# Patient Record
Sex: Male | Born: 2000 | Race: White | Hispanic: No | Marital: Single | Smoking: Former smoker
Health system: Southern US, Community
[De-identification: ages and names within clinical notes are randomized; demographics above are authoritative.]

## PROBLEM LIST (undated history)

## (undated) DIAGNOSIS — J45909 Unspecified asthma, uncomplicated: Secondary | ICD-10-CM

---

## 2018-06-17 ENCOUNTER — Emergency Department (INDEPENDENT_AMBULATORY_CARE_PROVIDER_SITE_OTHER): Payer: Managed Care, Other (non HMO)

## 2018-06-17 ENCOUNTER — Encounter: Payer: Self-pay | Admitting: Family Medicine

## 2018-06-17 ENCOUNTER — Other Ambulatory Visit: Payer: Self-pay

## 2018-06-17 ENCOUNTER — Emergency Department (INDEPENDENT_AMBULATORY_CARE_PROVIDER_SITE_OTHER)
Admission: EM | Admit: 2018-06-17 | Discharge: 2018-06-17 | Disposition: A | Discharge status: Home / Self Care | Payer: Managed Care, Other (non HMO) | Source: Home / Self Care

## 2018-06-17 DIAGNOSIS — X58XXXA Exposure to other specified factors, initial encounter: Secondary | ICD-10-CM

## 2018-06-17 DIAGNOSIS — S62339A Displaced fracture of neck of unspecified metacarpal bone, initial encounter for closed fracture: Secondary | ICD-10-CM

## 2018-06-17 DIAGNOSIS — S62306A Unspecified fracture of fifth metacarpal bone, right hand, initial encounter for closed fracture: Secondary | ICD-10-CM

## 2018-06-17 NOTE — ED Provider Notes (Addendum)
[facility]    CSN: [id number] Arrival date & time: 06/17/18  1752     History   Chief Complaint Chief Complaint  Patient presents with  . Hand Injury    HPI Daniel Lowe is a 17 y.o. male.   This is a 17 year old man who presents with right hand pain.  2 days ago he struck the outside of his right hand on the back of a chair with resultant pain and swelling.  This pain is persisted along with the swelling.     History reviewed. No pertinent past medical history.  There are no active problems to display for this patient.   History reviewed. No pertinent surgical history.     Home Medications    Prior to Admission medications   Medication Sig Start Date End Date Taking? Authorizing Provider  griseofulvin (GRIFULVIN V) 500 MG tablet Take by mouth daily.   Yes [provider]    Family History No family history on file.  Social History Social History   Tobacco Use  . Smoking status: Not on file  Substance Use Topics  . Alcohol use: Not on file  . Drug use: Not on file     Allergies   Patient has no known allergies.   Review of Systems Review of Systems   Physical Exam Triage Vital Signs ED Triage Vitals  Enc Vitals Group     BP      Pulse      Resp      Temp      Temp src      SpO2      Weight      Height      Head Circumference      Peak Flow      Pain Score      Pain Loc      Pain Edu?      Excl. in GC?    No data found.  Updated Vital Signs BP 99/69 (BP Location: Left Arm)   Pulse 96   Temp 98.6 F (37 C) (Oral)   Resp 16   Ht 6' (1.829 m)   Wt 68 kg   SpO2 98%   BMI 20.34 kg/m    Physical Exam  Constitutional: He is oriented to person, place, and time. He appears well-developed and well-nourished. No distress.  HENT:  Head: Normocephalic and atraumatic.  Right Ear: External ear normal.  Left Ear: External ear normal.  Mouth/Throat: Oropharynx is clear and moist.  Eyes: Pupils are equal,  round, and reactive to light. Conjunctivae are normal. No scleral icterus.  Neck: Normal range of motion. Neck supple.  Cardiovascular: Normal rate.  Pulmonary/Chest: Effort normal.  Musculoskeletal: Normal range of motion. He exhibits tenderness. He exhibits no deformity.  Mild swelling over the fifth meta carpal bone of the right hand  Neurological: He is alert and oriented to person, place, and time.  Skin: Skin is warm and dry. Rash noted. He is not diaphoretic.  2 cm area of dry skin on the upper triceps region of his left arm.  Psychiatric: He has a normal mood and affect. His behavior is normal.  Nursing note and vitals reviewed.    UC Treatments / Results  Labs (all labs ordered are listed, but only abnormal results are displayed) Labs Reviewed - No data to display  EKG None  Radiology Dg Hand Complete Right  Result Date: 06/17/2018 CLINICAL DATA:  Right hand trauma 2 days ago. Fifth metacarpal  pain. EXAM: RIGHT HAND - COMPLETE 3+ VIEW COMPARISON:  None. FINDINGS: Acute, closed, slightly comminuted nondisplaced fracture of the right fifth metacarpal shaft. No joint dislocation or intra-articular extension. Associated mild soft tissue swelling along the ulnar aspect of the hand. IMPRESSION: Acute, closed, nondisplaced and slightly comminuted right fifth metacarpal diaphyseal fracture without intra-articular extension. Ulnar-sided soft tissue swelling adjacent fracture. Electronically Signed   By: Tollie Eth M.D.   On: 06/17/2018 19:06    Procedures Splint Application Date/Time: 06/17/2018 6:53 PM Performed by: Elvina Sidle, MD Authorized by: Elvina Sidle, MD   Consent:    Consent obtained:  Verbal   Consent given by:  Patient and parent   Risks discussed:  Pain and swelling   Alternatives discussed:  No treatment Pre-procedure details:    Sensation:  Normal Procedure details:    Laterality:  Right   Location:  Arm   Arm:  R lower arm   Strapping: no      Splint type:  Ulnar gutter   Supplies:  Or[facility] Post-procedure details:    Pain:  Unchanged   Sensation:  Normal   Patient tolerance of procedure:  Tolerated well, no immediate complications   (including critical care time)  Medications Ordered in UC Medications - No data to display  Initial Impression / Assessment and Plan / UC Course  I have reviewed the triage vital signs and the nursing notes.  Pertinent labs & imaging results that were available during my care of the patient were reviewed by me and considered in my medical decision making (see chart for details).    Final Clinical Impressions(s) / UC Diagnoses   Final diagnoses:  Closed boxer's fracture, initial encounter     Discharge Instructions     You have Fifth metacarpal fracture on the right hand which is nondisplaced and without angulation.  This is often called a boxer's fracture.  You are going to need to keep the hand immobilized in a splint for at least 3 weeks and possibly 5 weeks.  The length of time depends on how well you are able to keep the hand immobilized and your natural healing tendency.  You will need to see your orthopedic in a week.  Please call them to make an appointment.    ED Prescriptions    None     Controlled Substance Prescriptions [place] Controlled Substance Registry consulted? Not Applicable   Elvina Sidle, MD 06/17/18 Hulen Luster, MD 06/17/18 Lynelle Smoke    Elvina Sidle, MD 06/17/18 [phone]

## 2018-06-17 NOTE — ED Triage Notes (Signed)
Patient hit hand on chair 2-3 days ago; he has used ice and taken OTCs without relief of pain.

## 2018-06-17 NOTE — Discharge Instructions (Addendum)
You have Fifth metacarpal fracture on the right hand which is nondisplaced and without angulation.  This is often called a boxer's fracture.  You are going to need to keep the hand immobilized in a splint for at least 3 weeks and possibly 5 weeks.  The length of time depends on how well you are able to keep the hand immobilized and your natural healing tendency.  You will need to see your orthopedic in a week.  Please call them to make an appointment.

## 2018-06-19 ENCOUNTER — Telehealth: Payer: Self-pay

## 2018-06-19 NOTE — Telephone Encounter (Signed)
Pt is removing splint to ice twice a day. Advil prn. Advised to call with any questions.

## 2019-04-24 ENCOUNTER — Encounter: Payer: Self-pay | Admitting: Emergency Medicine

## 2019-04-24 ENCOUNTER — Emergency Department (INDEPENDENT_AMBULATORY_CARE_PROVIDER_SITE_OTHER)
Admission: EM | Admit: 2019-04-24 | Discharge: 2019-04-24 | Disposition: A | Discharge status: Home / Self Care | Payer: Managed Care, Other (non HMO) | Source: Home / Self Care

## 2019-04-24 ENCOUNTER — Other Ambulatory Visit: Payer: Self-pay

## 2019-04-24 DIAGNOSIS — B9789 Other viral agents as the cause of diseases classified elsewhere: Secondary | ICD-10-CM | POA: Diagnosis not present

## 2019-04-24 DIAGNOSIS — J069 Acute upper respiratory infection, unspecified: Secondary | ICD-10-CM

## 2019-04-24 MED ORDER — BENZONATATE 100 MG PO CAPS: 100.0000 mg | ORAL_CAPSULE | Freq: Three times a day (TID) | ORAL | 0 refills | Status: DC

## 2019-04-24 NOTE — ED Provider Notes (Signed)
[facility]    CSN: [id number] Arrival date & time: 04/24/19  1409      History   Chief Complaint Chief Complaint  Patient presents with  . Cough    HPI Daniel Lowe is a 18 y.Lowe. male.   HPI  Daniel Lowe is a 18 y.Lowe. male presenting to UC with c/Lowe mildly productive cough with rhinorrhea the last 2 weeks. He did test positive for Covid-19 in June but had no symptoms last month.  Pt states congestion has gradually improved and cough is gradually improving as well but worse at night.  He is able to sleep through the night. Denies chest pain or SOB. No fever, chills, n/v/d. No HA or sore throat. No known sick contacts.    History reviewed. No pertinent past medical history.  There are no active problems to display for this patient.   History reviewed. No pertinent surgical history.     Home Medications    Prior to Admission medications   Medication Sig Start Date End Date Taking? Authorizing Provider  benzonatate (TESSALON) 100 MG capsule Take 1-2 capsules (100-200 mg total) by mouth every 8 (eight) hours. 04/24/19   Daniel Lowe, Daniel Canter O, PA-C  griseofulvin (GRIFULVIN V) 500 MG tablet Take by mouth daily.    [provider]    Family History No family history on file.  Social History Social History   Tobacco Use  . Smoking status: Former Games developermoker  . Smokeless tobacco: Never Used  Substance Use Topics  . Alcohol use: Not on file  . Drug use: Not on file     Allergies   Patient has no known allergies.   Review of Systems Review of Systems  Constitutional: Negative for chills and fever.  HENT: Positive for congestion and rhinorrhea. Negative for ear pain, postnasal drip, sinus pressure, sinus pain, sore throat, trouble swallowing and voice change.   Respiratory: Positive for cough. Negative for shortness of breath.   Cardiovascular: Negative for chest pain and palpitations.  Gastrointestinal: Negative for abdominal pain, diarrhea, nausea and  vomiting.  Musculoskeletal: Negative for arthralgias, back pain and myalgias.  Skin: Negative for rash.  Neurological: Negative for dizziness, light-headedness and headaches.     Physical Exam Triage Vital Signs ED Triage Vitals [04/24/19 1434]  Enc Vitals Group     BP 121/76     Pulse Rate 68     Resp      Temp 98.1 F (36.7 C)     Temp Source Oral     SpO2 99 %     Weight 145 lb (65.8 kg)     Height 6' (1.829 m)     Head Circumference      Peak Flow      Pain Score 0     Pain Loc      Pain Edu?      Excl. in GC?    No data found.  Updated Vital Signs BP 121/76 (BP Location: Right Arm)   Pulse 68   Temp 98.1 F (36.7 C) (Oral)   Ht 6' (1.829 m)   Wt 145 lb (65.8 kg)   SpO2 99%   BMI 19.67 kg/m   Visual Acuity Right Eye Distance:   Left Eye Distance:   Bilateral Distance:    Right Eye Near:   Left Eye Near:    Bilateral Near:     Physical Exam Vitals signs and nursing note reviewed.  Constitutional:      Appearance: Normal appearance.  He is well-developed.  HENT:     Head: Normocephalic and atraumatic.     Right Ear: Tympanic membrane normal.     Left Ear: Tympanic membrane normal.     Nose: Nose normal.     Right Sinus: No maxillary sinus tenderness or frontal sinus tenderness.     Left Sinus: No maxillary sinus tenderness or frontal sinus tenderness.     Mouth/Throat:     Lips: Pink.     Mouth: Mucous membranes are moist.     Pharynx: Oropharynx is clear. Uvula midline.  Neck:     Musculoskeletal: Normal range of motion.  Cardiovascular:     Rate and Rhythm: Normal rate and regular rhythm.  Pulmonary:     Effort: Pulmonary effort is normal. No respiratory distress.     Breath sounds: Normal breath sounds. No stridor. No wheezing, rhonchi or rales.  Musculoskeletal: Normal range of motion.  Skin:    General: Skin is warm and dry.  Neurological:     Mental Status: He is alert and oriented to person, place, and time.  Psychiatric:         Behavior: Behavior normal.      UC Treatments / Results  Labs (all labs ordered are listed, but only abnormal results are displayed) Labs Reviewed - No data to display  EKG   Radiology No results found.  Procedures Procedures (including critical care time)  Medications Ordered in UC Medications - No data to display  Initial Impression / Assessment and Plan / UC Course  I have reviewed the triage vital signs and the nursing notes.  Pertinent labs & imaging results that were available during my care of the patient were reviewed by me and considered in my medical decision making (see chart for details).     Lungs: CTAB Vitals: WNL No evidence of underlying bacterial infection at this time. Encouraged symptomatic tx F/u with PCP as needed.  Final Clinical Impressions(s) / UC Diagnoses   Final diagnoses:  Viral URI with cough     Discharge Instructions      You may take 500mg  acetaminophen every 4-6 hours or in combination with ibuprofen 400-600mg  every 6-8 hours as needed for pain, inflammation, and fever.  Be sure to well hydrated with clear liquids and get at least 8 hours of sleep at night, preferably more while sick.   Please follow up with family medicine in 1 week if needed.     ED Prescriptions    Medication Sig Dispense Auth. Provider   benzonatate (TESSALON) 100 MG capsule Take 1-2 capsules (100-200 mg total) by mouth every 8 (eight) hours. 21 capsule Daniel Gens, PA-C     Controlled Substance Prescriptions [place] Controlled Substance Registry consulted? Not Applicable   Daniel Lowe 04/24/19 1525

## 2019-04-24 NOTE — Discharge Instructions (Signed)
  You may take 500mg acetaminophen every 4-6 hours or in combination with ibuprofen 400-600mg every 6-8 hours as needed for pain, inflammation, and fever.  Be sure to well hydrated with clear liquids and get at least 8 hours of sleep at night, preferably more while sick.   Please follow up with family medicine in 1 week if needed.   

## 2019-04-24 NOTE — ED Triage Notes (Signed)
Patient c/o productive cough x 2 weeks, runny nose, tested positive for COVID in June.

## 2019-10-15 IMAGING — DX DG HAND COMPLETE 3+V*R*
3 series · 3 of 3 positions shown · non-contrast
Comparison: None.

CLINICAL DATA: Right hand trauma 2 days ago. Fifth metacarpal pain.

EXAM:
RIGHT HAND - COMPLETE 3+ VIEW

[hand pa]
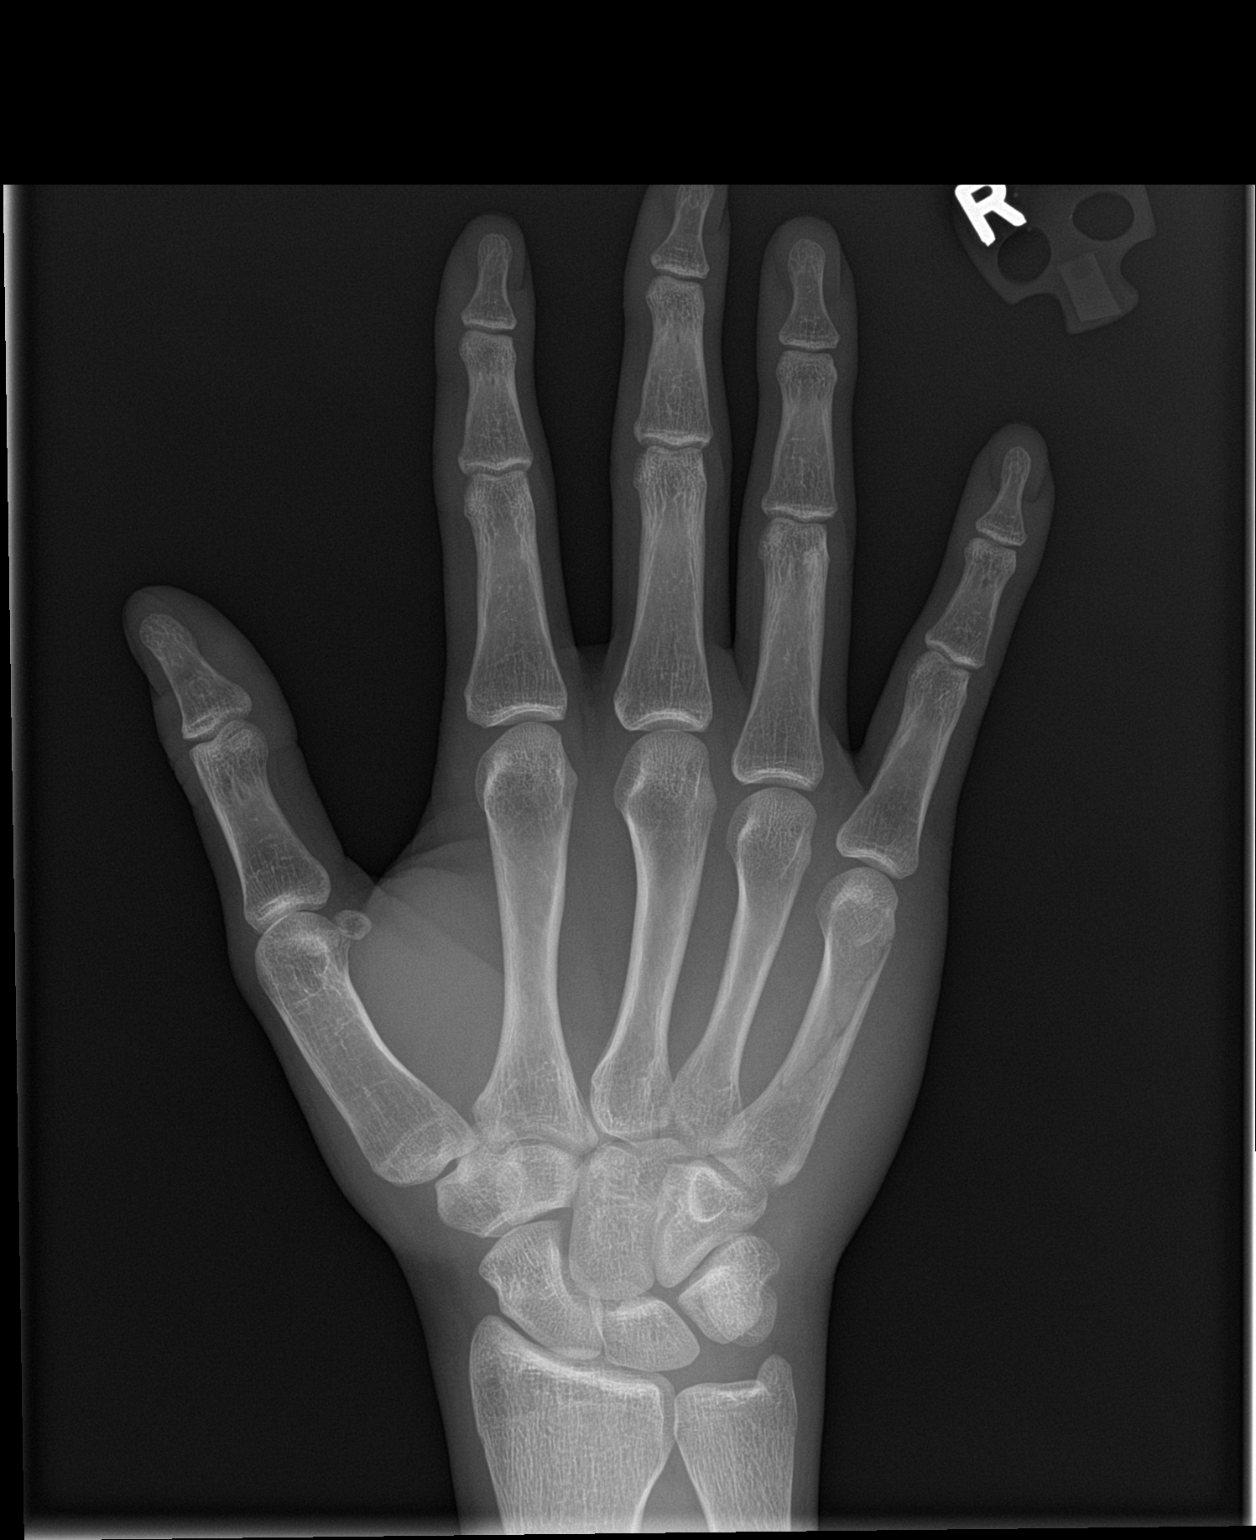

[hand obl]
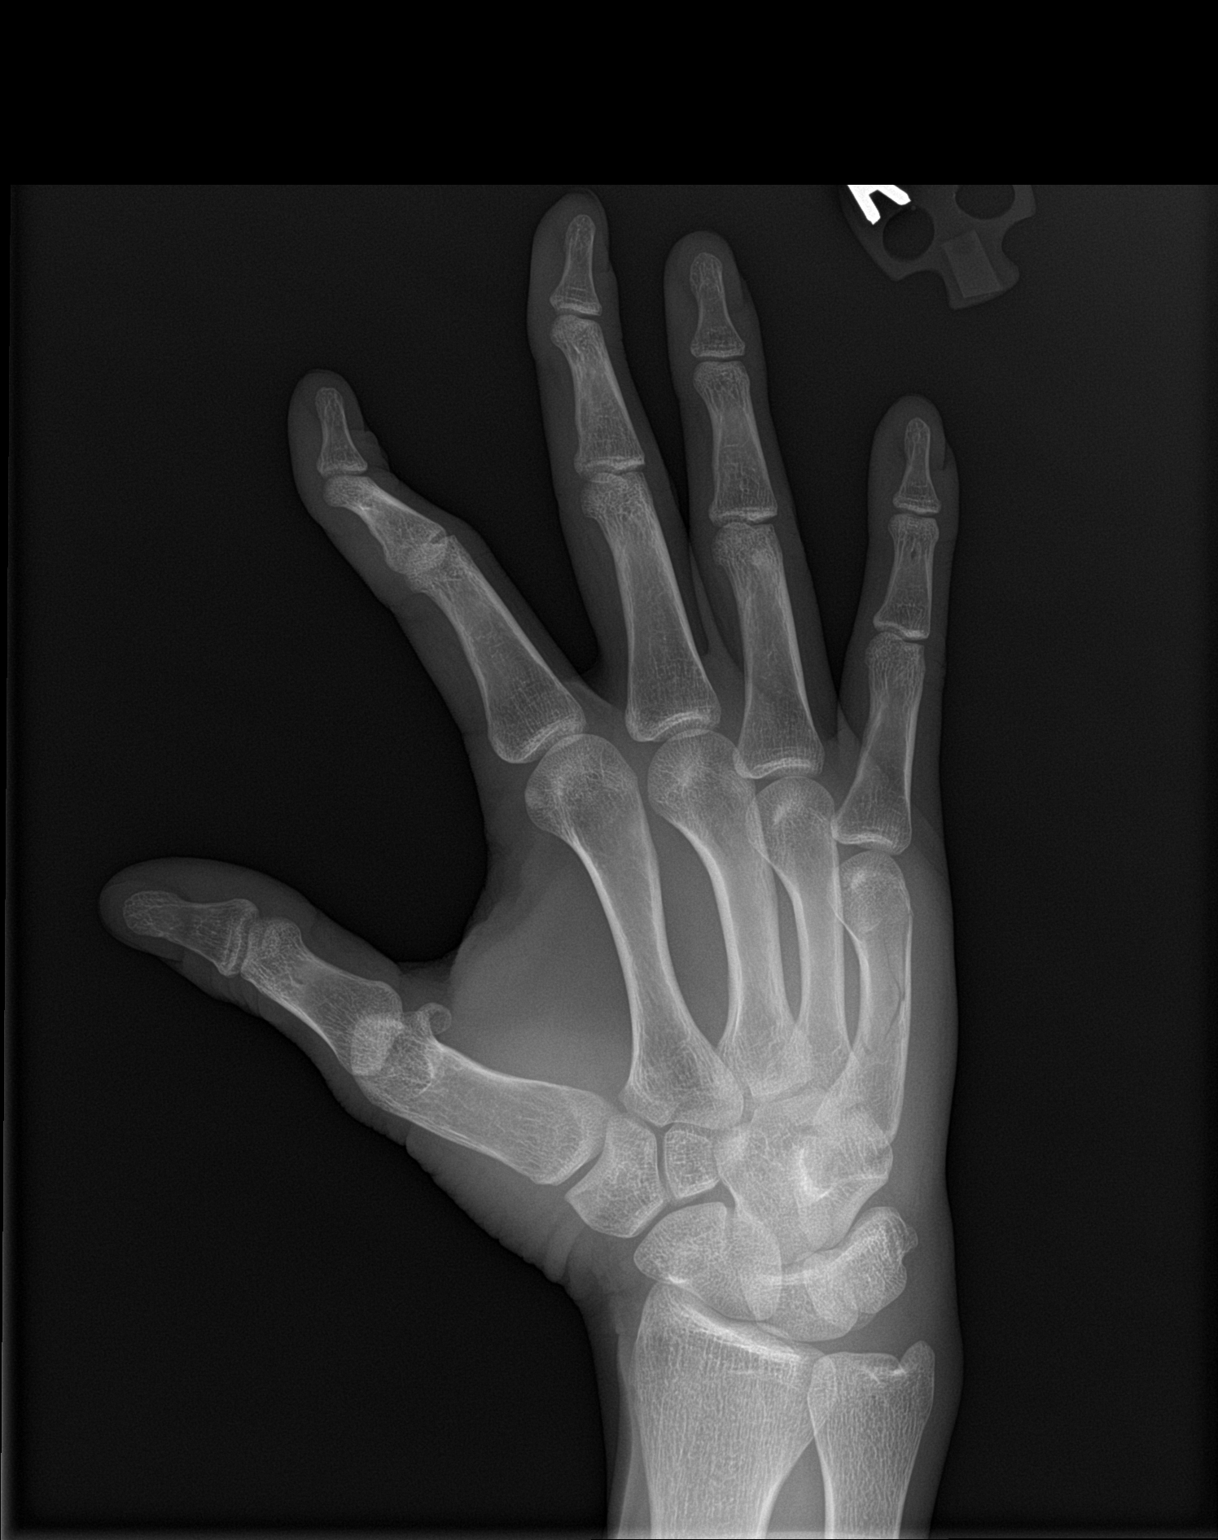

[hand lat]
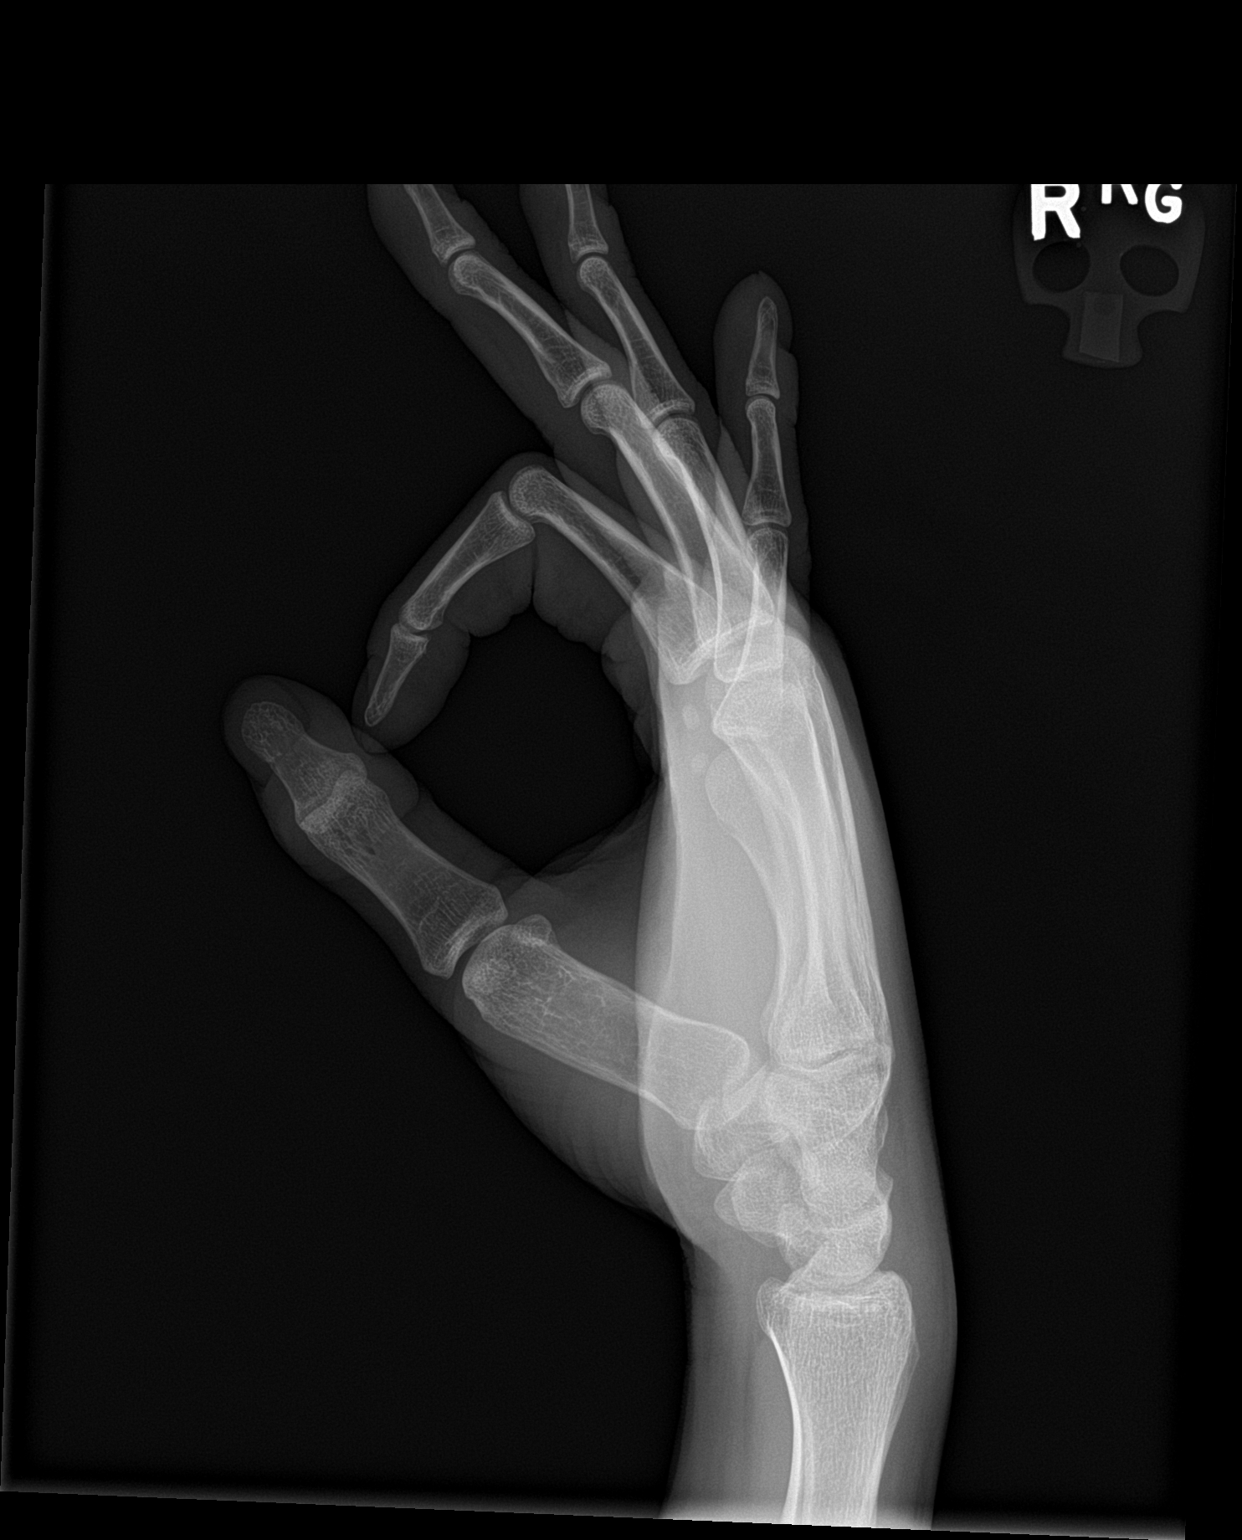

[3 of 3 positions shown; findings below may reference images not displayed]

FINDINGS: Acute, closed, slightly comminuted nondisplaced fracture of the
right fifth metacarpal shaft. No joint dislocation or
intra-articular extension. Associated mild soft tissue swelling
along the ulnar aspect of the hand.
IMPRESSION: Acute, closed, nondisplaced and slightly comminuted right fifth
metacarpal diaphyseal fracture without intra-articular extension.
Ulnar-sided soft tissue swelling adjacent fracture.

## 2022-09-03 ENCOUNTER — Ambulatory Visit
Admission: RE | Admit: 2022-09-03 | Discharge: 2022-09-03 | Disposition: A | Discharge status: Home / Self Care | Payer: 59 | Source: Ambulatory Visit | Attending: Family Medicine | Admitting: Family Medicine

## 2022-09-03 VITALS — BP 128/75 | Temp 98.2°F | Resp 17

## 2022-09-03 DIAGNOSIS — J111 Influenza due to unidentified influenza virus with other respiratory manifestations: Secondary | ICD-10-CM | POA: Diagnosis not present

## 2022-09-03 LAB — POCT INFLUENZA A/B
Influenza A, POC: NEGATIVE
Influenza B, POC: POSITIVE — AB

## 2022-09-03 LAB — POC SARS CORONAVIRUS 2 AG -  ED: SARS Coronavirus 2 Ag: NEGATIVE

## 2022-09-03 MED ORDER — OSELTAMIVIR PHOSPHATE 75 MG PO CAPS: 75.0000 mg | ORAL_CAPSULE | Freq: Two times a day (BID) | ORAL | 0 refills | Status: DC

## 2022-09-03 NOTE — Discharge Instructions (Signed)
Lots of water Take over-the-counter cough and cold medicine as needed Take ibuprofen or Tylenol for any pain and fever Take Tamiflu 2 times a day.  Call for problems

## 2022-09-03 NOTE — ED Triage Notes (Signed)
Pt c.o cough, congestion and bodyaches since Thurs.  No known covid or flu exposure. Taking Alkaseltzer prn.

## 2022-09-03 NOTE — ED Provider Notes (Signed)
[facility]    CSN: [id number] Arrival date & time: 09/03/22  1806      History   Chief Complaint Chief Complaint  Patient presents with   Cough    APPT 545PM   Nasal Congestion   Generalized Body Aches    HPI Daniel Lowe is a 22 y.o. male.   HPI  Cough congestion body aches.  He has been sick for 3 days.  No known exposure to illness.  Feels very tired.  Very achy.  History reviewed. No pertinent past medical history.  There are no problems to display for this patient.   History reviewed. No pertinent surgical history.     Home Medications    Prior to Admission medications   Medication Sig Start Date End Date Taking? Authorizing Provider  oseltamivir (TAMIFLU) 75 MG capsule Take 1 capsule (75 mg total) by mouth every 12 (twelve) hours. 09/03/22  Yes Raylene Everts, MD  griseofulvin (GRIFULVIN V) 500 MG tablet Take by mouth daily.    [provider]    Family History History reviewed. No pertinent family history.  Social History Social History   Tobacco Use   Smoking status: Former   Smokeless tobacco: Never     Allergies   Patient has no known allergies.   Review of Systems Review of Systems  See HPI Physical Exam Triage Vital Signs ED Triage Vitals  Enc Vitals Group     BP 09/03/22 1820 128/75     Pulse --      Resp 09/03/22 1820 17     Temp 09/03/22 1820 98.2 F (36.8 C)     Temp Source 09/03/22 1820 Oral     SpO2 09/03/22 1820 96 %     Weight --      Height --      Head Circumference --      Peak Flow --      Pain Score 09/03/22 1821 0     Pain Loc --      Pain Edu? --      Excl. in [place]? --    No data found.  Updated Vital Signs BP 128/75 (BP Location: Right Arm)   Temp 98.2 F (36.8 C) (Oral)   Resp 17   SpO2 96%      Physical Exam Constitutional:      General: He is not in acute distress.    Appearance: He is well-developed. He is ill-appearing.  HENT:     Head: Normocephalic and atraumatic.   Eyes:     Conjunctiva/sclera: Conjunctivae normal.     Pupils: Pupils are equal, round, and reactive to light.  Cardiovascular:     Rate and Rhythm: Normal rate.     Heart sounds: Normal heart sounds.  Pulmonary:     Effort: Pulmonary effort is normal. No respiratory distress.     Breath sounds: Normal breath sounds.  Abdominal:     General: There is no distension.     Palpations: Abdomen is soft.  Musculoskeletal:        General: Normal range of motion.     Cervical back: Normal range of motion.  Skin:    General: Skin is warm and dry.  Neurological:     Mental Status: He is alert.      UC Treatments / Results  Labs (all labs ordered are listed, but only abnormal results are displayed) Labs Reviewed  POCT INFLUENZA A/B - Abnormal; Notable for the following components:  Result Value   Influenza B, POC Positive (*)    All other components within normal limits  POC SARS CORONAVIRUS 2 AG -  ED    EKG   Radiology No results found.  Procedures Procedures (including critical care time)  Medications Ordered in UC Medications - No data to display  Initial Impression / Assessment and Plan / UC Course  I have reviewed the triage vital signs and the nursing notes.  Pertinent labs & imaging results that were available during my care of the patient were reviewed by me and considered in my medical decision making (see chart for details).    I told the patient that optimally Tamiflu should be started in the first 48 hours.  He states he is feeling sick as he can remember and desires a trial of Tamiflu benefits late.  Call for problems Final Clinical Impressions(s) / UC Diagnoses   Final diagnoses:  Influenza     Discharge Instructions      Lots of water Take over-the-counter cough and cold medicine as needed Take ibuprofen or Tylenol for any pain and fever Take Tamiflu 2 times a day.  Call for problems   ED Prescriptions     Medication Sig Dispense Auth.  Provider   oseltamivir (TAMIFLU) 75 MG capsule Take 1 capsule (75 mg total) by mouth every 12 (twelve) hours. 10 capsule Raylene Everts, MD      PDMP not reviewed this encounter.   Raylene Everts, MD 09/03/22 2102

## 2022-09-04 ENCOUNTER — Telehealth: Payer: Self-pay | Admitting: Emergency Medicine

## 2022-09-04 NOTE — Telephone Encounter (Signed)
[place].  Advised if doing well to disregard this call.  Any questions or concerns feel free to give the office a call back.

## 2023-10-06 ENCOUNTER — Ambulatory Visit
Admission: EM | Admit: 2023-10-06 | Discharge: 2023-10-06 | Disposition: A | Discharge status: Home / Self Care | Payer: 59 | Attending: Family Medicine | Admitting: Family Medicine

## 2023-10-06 DIAGNOSIS — J069 Acute upper respiratory infection, unspecified: Secondary | ICD-10-CM | POA: Diagnosis not present

## 2023-10-06 DIAGNOSIS — R059 Cough, unspecified: Secondary | ICD-10-CM | POA: Diagnosis not present

## 2023-10-06 HISTORY — DX: Unspecified asthma, uncomplicated: J45.909

## 2023-10-06 MED ORDER — PREDNISONE 10 MG (21) PO TBPK: ORAL_TABLET | Freq: Every day | ORAL | 0 refills | Status: AC

## 2023-10-06 MED ORDER — AZITHROMYCIN 250 MG PO TABS: 250.0000 mg | ORAL_TABLET | Freq: Every day | ORAL | 0 refills | Status: AC

## 2023-10-06 NOTE — ED Provider Notes (Signed)
[facility]    CSN: [id number] Arrival date & time: 10/06/23  1707      History   Chief Complaint Chief Complaint  Patient presents with   Cough    HPI Daniel Lowe is a 23 y.o. male.   HPI 23 year old male presents with cough for 5 to 6 days.  Patient was treated with Augmentin on 09/09/2023 for acute bacterial sinusitis.  Patient reports feeling much improved after taking this antibiotic; however, cough symptoms remain.  PMH significant for asthma.  Past Medical History:  Diagnosis Date   Asthma     There are no active problems to display for this patient.   History reviewed. No pertinent surgical history.     Home Medications    Prior to Admission medications   Medication Sig Start Date End Date Taking? Authorizing Provider  azithromycin (ZITHROMAX) 250 MG tablet Take 1 tablet (250 mg total) by mouth daily. Take first 2 tablets together, then 1 every day until finished. 10/06/23  Yes Trevor Iha, FNP  predniSONE (STERAPRED UNI-PAK 21 TAB) 10 MG (21) TBPK tablet Take by mouth daily. Take 6 tabs by mouth daily  for 2 days, then 5 tabs for 2 days, then 4 tabs for 2 days, then 3 tabs for 2 days, 2 tabs for 2 days, then 1 tab by mouth daily for 2 days 10/06/23  Yes Trevor Iha, FNP  griseofulvin (GRIFULVIN V) 500 MG tablet Take by mouth daily.    [provider]    Family History Family History  Problem Relation Age of Onset   Healthy Mother    Healthy Father     Social History Social History   Tobacco Use   Smoking status: Former   Smokeless tobacco: Never  Vaping Use   Vaping status: Some Days   Substances: Nicotine, Flavoring     Allergies   Patient has no known allergies.   Review of Systems Review of Systems  Respiratory:  Positive for cough.   All other systems reviewed and are negative.    Physical Exam Triage Vital Signs ED Triage Vitals [10/06/23 1728]  Encounter Vitals Group     BP      Systolic BP Percentile       Diastolic BP Percentile      Pulse      Resp      Temp      Temp src      SpO2      Weight      Height      Head Circumference      Peak Flow      Pain Score 0     Pain Loc      Pain Education      Exclude from Growth Chart    No data found.  Updated Vital Signs BP 118/69   Pulse 86   Temp 98.3 F (36.8 C)   Resp 17   SpO2 96%   Physical Exam Vitals and nursing note reviewed.  Constitutional:      General: He is not in acute distress.    Appearance: Normal appearance. He is normal weight. He is not ill-appearing.  HENT:     Head: Normocephalic and atraumatic.     Right Ear: Tympanic membrane, ear canal and external ear normal.     Left Ear: Tympanic membrane, ear canal and external ear normal.     Mouth/Throat:     Mouth: Mucous membranes are moist.  Pharynx: Oropharynx is clear.  Eyes:     Extraocular Movements: Extraocular movements intact.     Conjunctiva/sclera: Conjunctivae normal.     Pupils: Pupils are equal, round, and reactive to light.  Cardiovascular:     Rate and Rhythm: Normal rate and regular rhythm.     Pulses: Normal pulses.     Heart sounds: Normal heart sounds.  Pulmonary:     Effort: Pulmonary effort is normal.     Breath sounds: Normal breath sounds. No wheezing, rhonchi or rales.     Comments: Infrequent nonproductive cough on exam Musculoskeletal:        General: Normal range of motion.     Cervical back: Normal range of motion and neck supple.  Skin:    General: Skin is warm and dry.  Neurological:     General: No focal deficit present.     Mental Status: He is alert and oriented to person, place, and time. Mental status is at baseline.  Psychiatric:        Mood and Affect: Mood normal.        Behavior: Behavior normal.      UC Treatments / Results  Labs (all labs ordered are listed, but only abnormal results are displayed) Labs Reviewed - No data to display  EKG   Radiology No results  found.  Procedures Procedures (including critical care time)  Medications Ordered in UC Medications - No data to display  Initial Impression / Assessment and Plan / UC Course  I have reviewed the triage vital signs and the nursing notes.  Pertinent labs & imaging results that were available during my care of the patient were reviewed by me and considered in my medical decision making (see chart for details).     MDM: 1.  Acute URI-Rx Zithromax (500 mg day 1, then 250 mg day 2-5); 2.  Cough, unspecified type-Rx'd Sterapred Unipak 21 tab-10 mg. Advised patient to take medications as directed with food to completion.  Advised patient to take prednisone with Zithromax until complete.  Encouraged to increase daily water intake to 64 ounces per day while taking these medications.  Advised if symptoms worsen and/or unresolved please follow-up with your PCP or here for further evaluation. Final Clinical Impressions(s) / UC Diagnoses   Final diagnoses:  Cough, unspecified type  Acute URI     Discharge Instructions      Advised patient to take medications as directed with food to completion.  Advised patient to take prednisone with Zithromax until complete.  Encouraged to increase daily water intake to 64 ounces per day while taking these medications.  Advised if symptoms worsen and/or unresolved please follow-up with your PCP or here for further evaluation.     ED Prescriptions     Medication Sig Dispense Auth. Provider   azithromycin (ZITHROMAX) 250 MG tablet Take 1 tablet (250 mg total) by mouth daily. Take first 2 tablets together, then 1 every day until finished. 6 tablet Trevor Iha, FNP   predniSONE (STERAPRED UNI-PAK 21 TAB) 10 MG (21) TBPK tablet Take by mouth daily. Take 6 tabs by mouth daily  for 2 days, then 5 tabs for 2 days, then 4 tabs for 2 days, then 3 tabs for 2 days, 2 tabs for 2 days, then 1 tab by mouth daily for 2 days 42 tablet Trevor Iha, FNP      PDMP not  reviewed this encounter.   Trevor Iha, FNP 10/06/23 1743

## 2023-10-06 NOTE — ED Triage Notes (Signed)
Pt reports he was sick from 1/30 for about 4 days with a flu like illness. Pt has since had a lingering cough.

## 2023-10-06 NOTE — Discharge Instructions (Addendum)
Advised patient to take medications as directed with food to completion.  Advised patient to take prednisone with Zithromax until complete.  Encouraged to increase daily water intake to 64 ounces per day while taking these medications.  Advised if symptoms worsen and/or unresolved please follow-up with your PCP or here for further evaluation.
